# Patient Record
Sex: Male | Born: 1985
Health system: Southern US, Community
[De-identification: ages and names within clinical notes are randomized; demographics above are authoritative.]

## PROBLEM LIST (undated history)

## (undated) DIAGNOSIS — T7840XA Allergy, unspecified, initial encounter: Secondary | ICD-10-CM

## (undated) DIAGNOSIS — L309 Dermatitis, unspecified: Secondary | ICD-10-CM

## (undated) HISTORY — DX: Dermatitis, unspecified: L30.9

## (undated) HISTORY — DX: Allergy, unspecified, initial encounter: T78.40XA

---
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
## 2005-01-30 ENCOUNTER — Ambulatory Visit: Payer: Self-pay | Admitting: Family Medicine

## 2005-02-01 ENCOUNTER — Ambulatory Visit: Payer: Self-pay | Admitting: Gastroenterology

## 2005-03-06 ENCOUNTER — Ambulatory Visit: Payer: Self-pay | Admitting: Family Medicine

## 2007-04-05 ENCOUNTER — Ambulatory Visit: Payer: Self-pay | Admitting: Family Medicine

## 2007-04-05 DIAGNOSIS — K602 Anal fissure, unspecified: Secondary | ICD-10-CM

## 2012-09-30 ENCOUNTER — Telehealth: Payer: Self-pay | Admitting: Family Medicine

## 2012-09-30 NOTE — Telephone Encounter (Signed)
Okay but not in the next 2 weeks

## 2012-09-30 NOTE — Telephone Encounter (Signed)
Pt was last seen 2009. Pt would like to sch cpx. Pt has been away at college. Can I cpx or re-est appt?

## 2012-10-01 NOTE — Telephone Encounter (Signed)
lmom for pt to call back

## 2012-10-01 NOTE — Telephone Encounter (Signed)
Pt is sch for 10-8

## 2012-10-23 ENCOUNTER — Encounter: Payer: Self-pay | Admitting: Family Medicine

## 2012-10-23 ENCOUNTER — Ambulatory Visit (INDEPENDENT_AMBULATORY_CARE_PROVIDER_SITE_OTHER): Payer: Managed Care, Other (non HMO) | Admitting: Family Medicine

## 2012-10-23 VITALS — BP 120/62 | HR 71 | Temp 98.8°F | Ht 69.5 in | Wt 239.0 lb

## 2012-10-23 DIAGNOSIS — Z0000 Encounter for general adult medical examination without abnormal findings: Secondary | ICD-10-CM

## 2012-10-23 DIAGNOSIS — B354 Tinea corporis: Secondary | ICD-10-CM

## 2012-10-23 MED ORDER — FLUCONAZOLE 100 MG PO TABS: 100.0000 mg | ORAL_TABLET | Freq: Two times a day (BID) | ORAL | Status: DC

## 2012-10-23 NOTE — Progress Notes (Signed)
  Subjective:    Patient ID: William Santana, male    DOB: 1985-06-09, 27 y.o.   MRN: [id number]  HPI 27 yr old male to re-establish and for a cpx. He has only one problem to discuss. About 3 weeks ago he developed an itchy rash in both armpits. He went to Urgent Care and was told this was a fungal infection. He was given ketoconazole cream which he used for several weeks. This helped the rash but it never went away. Then it started to spread to other areas of the body, especially the trunk and the groins. He has recently been using OTC antifungal creams. He says he went back to the Urgent Care and had complete labs drawn to make sure he is not diabetic, etc. He says this all returned as normal. He does bring me a copy of some health screening labs he has had done in the past year, the last time being this past May. His fasting glucoses were 101 and 100 then. Over the past few months he has been dieting and working out, and he has lost 26 lbs.    Review of Systems  Constitutional: Negative.   HENT: Negative.   Eyes: Negative.   Respiratory: Negative.   Cardiovascular: Negative.   Gastrointestinal: Negative.   Genitourinary: Negative.   Musculoskeletal: Negative.   Skin: Positive for rash. Negative for color change, pallor and wound.  Neurological: Negative.   Psychiatric/Behavioral: Negative.        Objective:   Physical Exam  Constitutional: He is oriented to person, place, and time. He appears well-developed and well-nourished. No distress.  HENT:  Head: Normocephalic and atraumatic.  Right Ear: External ear normal.  Left Ear: External ear normal.  Nose: Nose normal.  Mouth/Throat: Oropharynx is clear and moist. No oropharyngeal exudate.  Eyes: Conjunctivae and EOM are normal. Pupils are equal, round, and reactive to light. Right eye exhibits no discharge. Left eye exhibits no discharge. No scleral icterus.  Neck: Neck supple. No JVD present. No tracheal deviation present. No  thyromegaly present.  Cardiovascular: Normal rate, regular rhythm, normal heart sounds and intact distal pulses.  Exam reveals no gallop and no friction rub.   No murmur heard. Pulmonary/Chest: Effort normal and breath sounds normal. No respiratory distress. He has no wheezes. He has no rales. He exhibits no tenderness.  Abdominal: Soft. Bowel sounds are normal. He exhibits no distension and no mass. There is no tenderness. There is no rebound and no guarding.  Genitourinary: Rectum normal, prostate normal and penis normal. Guaiac negative stool. No penile tenderness.  Musculoskeletal: Normal range of motion. He exhibits no edema and no tenderness.  Lymphadenopathy:    He has no cervical adenopathy.  Neurological: He is alert and oriented to person, place, and time. He has normal reflexes. No cranial nerve deficit. He exhibits normal muscle tone. Coordination normal.  Skin: Skin is warm and dry. Rash noted. He is not diaphoretic. No erythema. No pallor.  Hyperpigmented light brown rash in the axillae, under the breasts, and in the groins. He also has scattered smaller spots of this rash over the trunk   Psychiatric: He has a normal mood and affect. His behavior is normal. Judgment and thought content normal.          Assessment & Plan:  Well exam. He will sign a release so we can have his recent labs sent to [place]. Try oral fluconazole for the rash.

## 2012-12-06 ENCOUNTER — Encounter: Payer: Self-pay | Admitting: Family Medicine

## 2012-12-06 ENCOUNTER — Ambulatory Visit (INDEPENDENT_AMBULATORY_CARE_PROVIDER_SITE_OTHER): Payer: Managed Care, Other (non HMO) | Admitting: Family Medicine

## 2012-12-06 VITALS — BP 126/64 | HR 87 | Temp 98.9°F | Wt 236.0 lb

## 2012-12-06 DIAGNOSIS — R21 Rash and other nonspecific skin eruption: Secondary | ICD-10-CM

## 2012-12-06 DIAGNOSIS — B009 Herpesviral infection, unspecified: Secondary | ICD-10-CM

## 2012-12-06 MED ORDER — VALACYCLOVIR HCL 500 MG PO TABS: 500.0000 mg | ORAL_TABLET | Freq: Two times a day (BID) | ORAL | Status: DC

## 2012-12-06 NOTE — Progress Notes (Signed)
Pre visit review using our clinic review tool, if applicable. No additional management support is needed unless otherwise documented below in the visit note. 

## 2012-12-06 NOTE — Progress Notes (Signed)
  Subjective:    Patient ID: KAIEL WEIDE, male    DOB: May 03, 1985, 27 y.o.   MRN: [id number]  HPI Here to recheck rashes on the chest and in the axillae, and to ask about a new rash in the genital area. He was seen 6 weeks ago for a cpx, and we diagnosed a fungal infection as above. He took Diflucan 100 mg bid for a month with little improvement. Now for the past 4 days he has had a painful blistering rash on the penis and over the pubis.    Review of Systems  Constitutional: Negative.   Skin: Positive for rash.       Objective:   Physical Exam  Constitutional: He appears well-developed and well-nourished.  Skin:  Large macular areas of hyperpigmentation in both axillae and over the anterior trunk. He also has numerous shallow ulcerated areas and vesicles on the pubis and the penis          Assessment & Plan:  The large macular areas have not responded to the antifungal meds as expected, so we will refer him to Dermatology. He has his first outbreak of genital herpes, so we will treat with Valtrex.

## 2014-02-13 ENCOUNTER — Ambulatory Visit (INDEPENDENT_AMBULATORY_CARE_PROVIDER_SITE_OTHER): Payer: Managed Care, Other (non HMO) | Admitting: Family Medicine

## 2014-02-13 ENCOUNTER — Encounter: Payer: Self-pay | Admitting: Family Medicine

## 2014-02-13 VITALS — BP 104/62 | HR 76 | Temp 98.8°F | Ht 69.5 in | Wt 194.0 lb

## 2014-02-13 DIAGNOSIS — Z0000 Encounter for general adult medical examination without abnormal findings: Secondary | ICD-10-CM

## 2014-02-13 DIAGNOSIS — Z23 Encounter for immunization: Secondary | ICD-10-CM

## 2014-02-13 LAB — POCT URINALYSIS DIPSTICK
BILIRUBIN UA: NEGATIVE
GLUCOSE UA: NEGATIVE
Ketones, UA: NEGATIVE
Leukocytes, UA: NEGATIVE
Nitrite, UA: NEGATIVE
PH UA: 7
Protein, UA: NEGATIVE
RBC UA: NEGATIVE
Spec Grav, UA: 1.01
Urobilinogen, UA: 0.2

## 2014-02-13 LAB — CBC WITH DIFFERENTIAL/PLATELET
Basophils Absolute: 0 10*3/uL (ref 0.0–0.1)
Basophils Relative: 0.5 % (ref 0.0–3.0)
Eosinophils Absolute: 0.1 10*3/uL (ref 0.0–0.7)
Eosinophils Relative: 1 % (ref 0.0–5.0)
HEMATOCRIT: 39.6 % (ref 39.0–52.0)
HEMOGLOBIN: 13.5 g/dL (ref 13.0–17.0)
Lymphocytes Relative: 23.3 % (ref 12.0–46.0)
Lymphs Abs: 1.5 10*3/uL (ref 0.7–4.0)
MCHC: 34 g/dL (ref 30.0–36.0)
MCV: 84.2 fl (ref 78.0–100.0)
Monocytes Absolute: 0.7 10*3/uL (ref 0.1–1.0)
Monocytes Relative: 10.5 % (ref 3.0–12.0)
Neutro Abs: 4.2 10*3/uL (ref 1.4–7.7)
Neutrophils Relative %: 64.7 % (ref 43.0–77.0)
Platelets: 343 10*3/uL (ref 150.0–400.0)
RBC: 4.71 Mil/uL (ref 4.22–5.81)
RDW: 13.7 % (ref 11.5–15.5)
WBC: 6.5 10*3/uL (ref 4.0–10.5)

## 2014-02-13 LAB — TSH: TSH: 0.63 u[IU]/mL (ref 0.35–4.50)

## 2014-02-13 LAB — BASIC METABOLIC PANEL
BUN: 12 mg/dL (ref 6–23)
CHLORIDE: 102 meq/L (ref 96–112)
CO2: 26 meq/L (ref 19–32)
Calcium: 9.8 mg/dL (ref 8.4–10.5)
Creatinine, Ser: 0.98 mg/dL (ref 0.40–1.50)
GFR: 96.24 mL/min (ref >60.00)
Glucose, Bld: 86 mg/dL (ref 70–99)
POTASSIUM: 4.8 meq/L (ref 3.5–5.1)
SODIUM: 138 meq/L (ref 135–145)

## 2014-02-13 LAB — HEPATIC FUNCTION PANEL
ALK PHOS: 83 U/L (ref 39–117)
ALT: 24 U/L (ref 0–53)
AST: 20 U/L (ref 0–37)
Albumin: 4.2 g/dL (ref 3.5–5.2)
BILIRUBIN DIRECT: 0.2 mg/dL (ref 0.0–0.3)
Total Bilirubin: 1 mg/dL (ref 0.2–1.2)
Total Protein: 8.1 g/dL (ref 6.0–8.3)

## 2014-02-13 MED ORDER — TERBINAFINE HCL 1 % EX CREA: 1.0000 "application " | TOPICAL_CREAM | Freq: Two times a day (BID) | CUTANEOUS | Status: DC

## 2014-02-13 MED ORDER — TRIAMCINOLONE ACETONIDE 0.1 % EX CREA: 1.0000 "application " | TOPICAL_CREAM | Freq: Three times a day (TID) | CUTANEOUS | Status: AC

## 2014-02-13 NOTE — Addendum Note (Signed)
Addended by: Aniceto BossNIMMONS, Zyana Amaro A on: 02/13/2014 12:27 PM   Modules accepted: Orders

## 2014-02-13 NOTE — Progress Notes (Signed)
Pre visit review using our clinic review tool, if applicable. No additional management support is needed unless otherwise documented below in the visit note. 

## 2014-02-13 NOTE — Progress Notes (Signed)
   Subjective:    Patient ID: William MedinMatthew D Santana, male    DOB: 07/11/1985, 29 y.o.   MRN: [id number]  HPI 29 yr old male for a cpx he is doing well except for skin issues. He saw Dr. Ethelene Halathcart last year for an unusual rash and a biopsy revealed it to be a form of eczema. He uses triamacinolone cream for this prn. He also gets an occasional rash in the groins area that had been felt to be herpetic. However last year he saw a doctor who tested for herpes simplex and this was negative. He was treated for tinea cruris with antifungal meds and it resolved. He had a health screening on his job a few months ago and had a normal lipid panel.    Review of Systems  Constitutional: Negative.   HENT: Negative.   Eyes: Negative.   Respiratory: Negative.   Cardiovascular: Negative.   Gastrointestinal: Negative.   Genitourinary: Negative.   Musculoskeletal: Negative.   Skin: Negative.   Neurological: Negative.   Psychiatric/Behavioral: Negative.        Objective:   Physical Exam  Constitutional: He is oriented to person, place, and time. He appears well-developed and well-nourished. No distress.  HENT:  Head: Normocephalic and atraumatic.  Right Ear: External ear normal.  Left Ear: External ear normal.  Nose: Nose normal.  Mouth/Throat: Oropharynx is clear and moist. No oropharyngeal exudate.  Eyes: Conjunctivae and EOM are normal. Pupils are equal, round, and reactive to light. Right eye exhibits no discharge. Left eye exhibits no discharge. No scleral icterus.  Neck: Neck supple. No JVD present. No tracheal deviation present. No thyromegaly present.  Cardiovascular: Normal rate, regular rhythm, normal heart sounds and intact distal pulses.  Exam reveals no gallop and no friction rub.   No murmur heard. Pulmonary/Chest: Effort normal and breath sounds normal. No respiratory distress. He has no wheezes. He has no rales. He exhibits no tenderness.  Abdominal: Soft. Bowel sounds are normal. He exhibits  no distension and no mass. There is no tenderness. There is no rebound and no guarding.  Genitourinary: Rectum normal, prostate normal and penis normal. Guaiac negative stool. No penile tenderness.  Musculoskeletal: Normal range of motion. He exhibits no edema or tenderness.  Lymphadenopathy:    He has no cervical adenopathy.  Neurological: He is alert and oriented to person, place, and time. He has normal reflexes. No cranial nerve deficit. He exhibits normal muscle tone. Coordination normal.  Skin: Skin is warm and dry. No rash noted. He is not diaphoretic. No erythema. No pallor.  Psychiatric: He has a normal mood and affect. His behavior is normal. Judgment and thought content normal.          Assessment & Plan:  Well exam. Get labs

## 2015-05-19 ENCOUNTER — Ambulatory Visit (INDEPENDENT_AMBULATORY_CARE_PROVIDER_SITE_OTHER): Payer: Managed Care, Other (non HMO) | Admitting: Family Medicine

## 2015-05-19 ENCOUNTER — Encounter: Payer: Self-pay | Admitting: Family Medicine

## 2015-05-19 VITALS — BP 112/72 | HR 88 | Temp 98.3°F | Resp 16 | Ht 69.5 in | Wt 199.0 lb

## 2015-05-19 DIAGNOSIS — Z0000 Encounter for general adult medical examination without abnormal findings: Secondary | ICD-10-CM

## 2015-05-19 NOTE — Progress Notes (Signed)
   Subjective:    Patient ID: William Santana, male    DOB: 12/01/1985, 30 y.o.   MRN: [id number]  HPI 30 yr old male for a cpx. He feels well. He has a wellness lab screening every year at his job, and this will be coming up in a few weeks.    Review of Systems  Constitutional: Negative.   HENT: Negative.   Eyes: Negative.   Respiratory: Negative.   Cardiovascular: Negative.   Gastrointestinal: Negative.   Genitourinary: Negative.   Musculoskeletal: Negative.   Skin: Negative.   Neurological: Negative.   Psychiatric/Behavioral: Negative.        Objective:   Physical Exam  Constitutional: He is oriented to person, place, and time. He appears well-developed and well-nourished. No distress.  HENT:  Head: Normocephalic and atraumatic.  Right Ear: External ear normal.  Left Ear: External ear normal.  Nose: Nose normal.  Mouth/Throat: Oropharynx is clear and moist. No oropharyngeal exudate.  Eyes: Conjunctivae and EOM are normal. Pupils are equal, round, and reactive to light. Right eye exhibits no discharge. Left eye exhibits no discharge. No scleral icterus.  Neck: Neck supple. No JVD present. No tracheal deviation present. No thyromegaly present.  Cardiovascular: Normal rate, regular rhythm, normal heart sounds and intact distal pulses.  Exam reveals no gallop and no friction rub.   No murmur heard. Pulmonary/Chest: Effort normal and breath sounds normal. No respiratory distress. He has no wheezes. He has no rales. He exhibits no tenderness.  Abdominal: Soft. Bowel sounds are normal. He exhibits no distension and no mass. There is no tenderness. There is no rebound and no guarding.  Genitourinary: Rectum normal, prostate normal and penis normal. Guaiac negative stool. No penile tenderness.  Musculoskeletal: Normal range of motion. He exhibits no edema or tenderness.  Lymphadenopathy:    He has no cervical adenopathy.  Neurological: He is alert and oriented to person, place, and  time. He has normal reflexes. No cranial nerve deficit. He exhibits normal muscle tone. Coordination normal.  Skin: Skin is warm and dry. No rash noted. He is not diaphoretic. No erythema. No pallor.  Psychiatric: He has a normal mood and affect. His behavior is normal. Judgment and thought content normal.          Assessment & Plan:  Well exam. We discussed diet and exercise. He will forward us a copy of his labs when they are back.  Nelwyn SalisburyFRY,STEPHEN A, MD

## 2016-05-23 ENCOUNTER — Encounter: Payer: Managed Care, Other (non HMO) | Admitting: Family Medicine

## 2016-06-02 ENCOUNTER — Encounter: Payer: Self-pay | Admitting: Family Medicine

## 2016-06-02 ENCOUNTER — Ambulatory Visit (INDEPENDENT_AMBULATORY_CARE_PROVIDER_SITE_OTHER): Payer: BLUE CROSS/BLUE SHIELD | Admitting: Family Medicine

## 2016-06-02 VITALS — BP 108/68 | HR 64 | Temp 97.8°F | Ht 69.5 in | Wt 204.0 lb

## 2016-06-02 DIAGNOSIS — Z0000 Encounter for general adult medical examination without abnormal findings: Secondary | ICD-10-CM | POA: Diagnosis not present

## 2016-06-02 LAB — BASIC METABOLIC PANEL
BUN: 14 mg/dL (ref 6–23)
CO2: 32 meq/L (ref 19–32)
Calcium: 9.4 mg/dL (ref 8.4–10.5)
Chloride: 103 mEq/L (ref 96–112)
Creatinine, Ser: 1.02 mg/dL (ref 0.40–1.50)
GFR: 90.47 mL/min (ref >60.00)
GLUCOSE: 98 mg/dL (ref 70–99)
Potassium: 4.3 mEq/L (ref 3.5–5.1)
SODIUM: 140 meq/L (ref 135–145)

## 2016-06-02 LAB — POC URINALSYSI DIPSTICK (AUTOMATED)
Bilirubin, UA: NEGATIVE
Blood, UA: NEGATIVE
GLUCOSE UA: NEGATIVE
KETONES UA: NEGATIVE
Leukocytes, UA: NEGATIVE
Nitrite, UA: NEGATIVE
Protein, UA: NEGATIVE
SPEC GRAV UA: 1.01 (ref 1.010–1.025)
Urobilinogen, UA: 0.2 E.U./dL
pH, UA: 7 (ref 5.0–8.0)

## 2016-06-02 LAB — CBC WITH DIFFERENTIAL/PLATELET
BASOS ABS: 0 10*3/uL (ref 0.0–0.1)
BASOS PCT: 0.5 % (ref 0.0–3.0)
EOS ABS: 0.2 10*3/uL (ref 0.0–0.7)
Eosinophils Relative: 3.1 % (ref 0.0–5.0)
HEMATOCRIT: 36.2 % — AB (ref 39.0–52.0)
HEMOGLOBIN: 11.8 g/dL — AB (ref 13.0–17.0)
LYMPHS PCT: 16.6 % (ref 12.0–46.0)
Lymphs Abs: 1.3 10*3/uL (ref 0.7–4.0)
MCHC: 32.6 g/dL (ref 30.0–36.0)
MCV: 81.7 fl (ref 78.0–100.0)
MONO ABS: 0.8 10*3/uL (ref 0.1–1.0)
Monocytes Relative: 9.9 % (ref 3.0–12.0)
Neutro Abs: 5.6 10*3/uL (ref 1.4–7.7)
Neutrophils Relative %: 69.9 % (ref 43.0–77.0)
Platelets: 405 10*3/uL — ABNORMAL HIGH (ref 150.0–400.0)
RBC: 4.43 Mil/uL (ref 4.22–5.81)
RDW: 15 % (ref 11.5–15.5)
WBC: 8 10*3/uL (ref 4.0–10.5)

## 2016-06-02 LAB — HEPATIC FUNCTION PANEL
ALK PHOS: 75 U/L (ref 39–117)
ALT: 15 U/L (ref 0–53)
AST: 16 U/L (ref 0–37)
Albumin: 4 g/dL (ref 3.5–5.2)
BILIRUBIN DIRECT: 0.2 mg/dL (ref 0.0–0.3)
Total Bilirubin: 0.7 mg/dL (ref 0.2–1.2)
Total Protein: 7.6 g/dL (ref 6.0–8.3)

## 2016-06-02 LAB — TSH: TSH: 1.9 u[IU]/mL (ref 0.35–4.50)

## 2016-06-02 LAB — LIPID PANEL
CHOLESTEROL: 159 mg/dL (ref 0–200)
HDL: 38 mg/dL — AB (ref >39.00)
LDL Cholesterol: 107 mg/dL — ABNORMAL HIGH (ref 0–99)
NONHDL: 121.14
Total CHOL/HDL Ratio: 4
Triglycerides: 71 mg/dL (ref 0.0–149.0)
VLDL: 14.2 mg/dL (ref 0.0–40.0)

## 2016-06-02 NOTE — Progress Notes (Signed)
   Subjective:    Patient ID: William Santana, male    DOB: 03/30/1985, 31 y.o.   MRN: [id number]  HPI 31 yr old male for a well exam. He feels fine.    Review of Systems  Constitutional: Negative.   HENT: Negative.   Eyes: Negative.   Respiratory: Negative.   Cardiovascular: Negative.   Gastrointestinal: Negative.   Genitourinary: Negative.   Musculoskeletal: Negative.   Skin: Negative.   Neurological: Negative.   Psychiatric/Behavioral: Negative.        Objective:   Physical Exam  Constitutional: He is oriented to person, place, and time. He appears well-developed and well-nourished. No distress.  HENT:  Head: Normocephalic and atraumatic.  Right Ear: External ear normal.  Left Ear: External ear normal.  Nose: Nose normal.  Mouth/Throat: Oropharynx is clear and moist. No oropharyngeal exudate.  Eyes: Conjunctivae and EOM are normal. Pupils are equal, round, and reactive to light. Right eye exhibits no discharge. Left eye exhibits no discharge. No scleral icterus.  Neck: Neck supple. No JVD present. No tracheal deviation present. No thyromegaly present.  Cardiovascular: Normal rate, regular rhythm, normal heart sounds and intact distal pulses.  Exam reveals no gallop and no friction rub.   No murmur heard. Pulmonary/Chest: Effort normal and breath sounds normal. No respiratory distress. He has no wheezes. He has no rales. He exhibits no tenderness.  Abdominal: Soft. Bowel sounds are normal. He exhibits no distension and no mass. There is no tenderness. There is no rebound and no guarding.  Genitourinary: Penis normal. No penile tenderness.  Musculoskeletal: Normal range of motion. He exhibits no edema or tenderness.  Lymphadenopathy:    He has no cervical adenopathy.  Neurological: He is alert and oriented to person, place, and time. He has normal reflexes. No cranial nerve deficit. He exhibits normal muscle tone. Coordination normal.  Skin: Skin is warm and dry. No rash  noted. He is not diaphoretic. No erythema. No pallor.  Psychiatric: He has a normal mood and affect. His behavior is normal. Judgment and thought content normal.          Assessment & Plan:  Well exam. We discussed diet and exercise. Get labs today.  Gershon CraneStephen Fry, MD

## 2016-06-02 NOTE — Patient Instructions (Signed)
WE NOW OFFER   [facility] FAST TRACK!!!  SAME DAY Appointments for ACUTE CARE  Such as: Sprains, Injuries, cuts, abrasions, rashes, muscle pain, joint pain, back pain Colds, flu, sore throats, headache, allergies, cough, fever  Ear pain, sinus and eye infections Abdominal pain, nausea, vomiting, diarrhea, upset stomach Animal/insect bites  3 Easy Ways to Schedule: Walk-In Scheduling Call in scheduling Mychart Sign-up: [email or web address]         

## 2017-05-05 DIAGNOSIS — H52223 Regular astigmatism, bilateral: Secondary | ICD-10-CM | POA: Diagnosis not present

## 2017-05-05 DIAGNOSIS — Z0100 Encounter for examination of eyes and vision without abnormal findings: Secondary | ICD-10-CM | POA: Diagnosis not present

## 2017-07-02 ENCOUNTER — Ambulatory Visit (INDEPENDENT_AMBULATORY_CARE_PROVIDER_SITE_OTHER): Payer: 59 | Admitting: Family Medicine

## 2017-07-02 ENCOUNTER — Ambulatory Visit (INDEPENDENT_AMBULATORY_CARE_PROVIDER_SITE_OTHER)
Admission: RE | Admit: 2017-07-02 | Discharge: 2017-07-02 | Disposition: A | Discharge status: Home / Self Care | Payer: 59 | Source: Ambulatory Visit | Attending: Family Medicine | Admitting: Family Medicine

## 2017-07-02 ENCOUNTER — Encounter: Payer: Self-pay | Admitting: Family Medicine

## 2017-07-02 VITALS — BP 112/78 | HR 74 | Temp 98.5°F | Ht 69.25 in | Wt 211.6 lb

## 2017-07-02 DIAGNOSIS — Z0000 Encounter for general adult medical examination without abnormal findings: Secondary | ICD-10-CM | POA: Diagnosis not present

## 2017-07-02 DIAGNOSIS — R05 Cough: Secondary | ICD-10-CM

## 2017-07-02 DIAGNOSIS — R059 Cough, unspecified: Secondary | ICD-10-CM

## 2017-07-02 LAB — CBC WITH DIFFERENTIAL/PLATELET
BASOS PCT: 0.5 % (ref 0.0–3.0)
Basophils Absolute: 0 10*3/uL (ref 0.0–0.1)
Eosinophils Absolute: 0.2 10*3/uL (ref 0.0–0.7)
Eosinophils Relative: 2.5 % (ref 0.0–5.0)
HEMATOCRIT: 38.5 % — AB (ref 39.0–52.0)
Hemoglobin: 12.8 g/dL — ABNORMAL LOW (ref 13.0–17.0)
LYMPHS PCT: 26 % (ref 12.0–46.0)
Lymphs Abs: 1.8 10*3/uL (ref 0.7–4.0)
MCHC: 33.3 g/dL (ref 30.0–36.0)
MCV: 83.4 fl (ref 78.0–100.0)
MONO ABS: 0.6 10*3/uL (ref 0.1–1.0)
Monocytes Relative: 9.4 % (ref 3.0–12.0)
NEUTROS ABS: 4.2 10*3/uL (ref 1.4–7.7)
Neutrophils Relative %: 61.6 % (ref 43.0–77.0)
PLATELETS: 344 10*3/uL (ref 150.0–400.0)
RBC: 4.61 Mil/uL (ref 4.22–5.81)
RDW: 14.9 % (ref 11.5–15.5)
WBC: 6.8 10*3/uL (ref 4.0–10.5)

## 2017-07-02 LAB — POC URINALSYSI DIPSTICK (AUTOMATED)
BILIRUBIN UA: NEGATIVE
GLUCOSE UA: NEGATIVE
Ketones, UA: NEGATIVE
Leukocytes, UA: NEGATIVE
Nitrite, UA: NEGATIVE
Protein, UA: NEGATIVE
RBC UA: NEGATIVE
SPEC GRAV UA: 1.01 (ref 1.010–1.025)
Urobilinogen, UA: 0.2 E.U./dL
pH, UA: 6.5 (ref 5.0–8.0)

## 2017-07-02 LAB — HEPATIC FUNCTION PANEL
ALT: 18 U/L (ref 0–53)
AST: 16 U/L (ref 0–37)
Albumin: 4 g/dL (ref 3.5–5.2)
Alkaline Phosphatase: 62 U/L (ref 39–117)
BILIRUBIN DIRECT: 0.1 mg/dL (ref 0.0–0.3)
BILIRUBIN TOTAL: 0.7 mg/dL (ref 0.2–1.2)
Total Protein: 7.5 g/dL (ref 6.0–8.3)

## 2017-07-02 LAB — LIPID PANEL
CHOL/HDL RATIO: 4
Cholesterol: 152 mg/dL (ref 0–200)
HDL: 39.4 mg/dL (ref >39.00)
LDL Cholesterol: 96 mg/dL (ref 0–99)
NONHDL: 112.83
TRIGLYCERIDES: 84 mg/dL (ref 0.0–149.0)
VLDL: 16.8 mg/dL (ref 0.0–40.0)

## 2017-07-02 LAB — BASIC METABOLIC PANEL
BUN: 10 mg/dL (ref 6–23)
CALCIUM: 9.4 mg/dL (ref 8.4–10.5)
CHLORIDE: 102 meq/L (ref 96–112)
CO2: 28 mEq/L (ref 19–32)
CREATININE: 0.97 mg/dL (ref 0.40–1.50)
GFR: 95.21 mL/min (ref >60.00)
Glucose, Bld: 81 mg/dL (ref 70–99)
Potassium: 4.2 mEq/L (ref 3.5–5.1)
Sodium: 140 mEq/L (ref 135–145)

## 2017-07-02 LAB — TSH: TSH: 0.87 u[IU]/mL (ref 0.35–4.50)

## 2017-07-02 NOTE — Progress Notes (Signed)
   Subjective:    Patient ID: William Santana, male    DOB: 04/23/1985, 32 y.o.   MRN: [id number]  HPI Here for a well exam. He feels well today. About 2 weeks ago he developed a chest cold with a cough that produced yellow sputum, but this went away. No fever. He now feels back to normal.    Review of Systems  Constitutional: Negative.   HENT: Negative.   Eyes: Negative.   Respiratory: Negative.   Cardiovascular: Negative.   Gastrointestinal: Negative.   Genitourinary: Negative.   Musculoskeletal: Negative.   Skin: Negative.   Neurological: Negative.   Psychiatric/Behavioral: Negative.        Objective:   Physical Exam  Constitutional: He is oriented to person, place, and time. He appears well-developed and well-nourished. No distress.  HENT:  Head: Normocephalic and atraumatic.  Right Ear: External ear normal.  Left Ear: External ear normal.  Nose: Nose normal.  Mouth/Throat: Oropharynx is clear and moist. No oropharyngeal exudate.  Eyes: Pupils are equal, round, and reactive to light. Conjunctivae and EOM are normal. Right eye exhibits no discharge. Left eye exhibits no discharge. No scleral icterus.  Neck: Neck supple. No JVD present. No tracheal deviation present. No thyromegaly present.  Cardiovascular: Normal rate, regular rhythm, normal heart sounds and intact distal pulses. Exam reveals no gallop and no friction rub.  No murmur heard. Pulmonary/Chest: Effort normal. No respiratory distress. He has no rales. He exhibits no tenderness.  There are some soft rhonchi and wheezes on the right side midway down. These do not clear with coughing.   Abdominal: Soft. Bowel sounds are normal. He exhibits no distension and no mass. There is no tenderness. There is no rebound and no guarding.  Genitourinary: Rectum normal, prostate normal and penis normal. Rectal exam shows guaiac negative stool. No penile tenderness.  Musculoskeletal: Normal range of motion. He exhibits no edema or  tenderness.  Lymphadenopathy:    He has no cervical adenopathy.  Neurological: He is alert and oriented to person, place, and time. He has normal reflexes. He displays normal reflexes. No cranial nerve deficit. He exhibits normal muscle tone. Coordination normal.  Skin: Skin is warm and dry. No rash noted. He is not diaphoretic. No erythema. No pallor.  Psychiatric: He has a normal mood and affect. His behavior is normal. Judgment and thought content normal.          Assessment & Plan:  Well exam. We discussed diet and exercise. Get fasting labs. I am not sure what to make of the abnormal breath sounds on the right side, so we will send him for a CXR today.  Gershon CraneStephen Fry, MD

## 2018-11-28 DIAGNOSIS — Z20828 Contact with and (suspected) exposure to other viral communicable diseases: Secondary | ICD-10-CM | POA: Diagnosis not present

## 2019-10-28 IMAGING — DX DG CHEST 2V
2 series · 2 of 2 positions shown · non-contrast
Comparison: None

CLINICAL DATA: Cough congestion and cold for 2 weeks

EXAM:
CHEST - 2 VIEW

[chest pa]
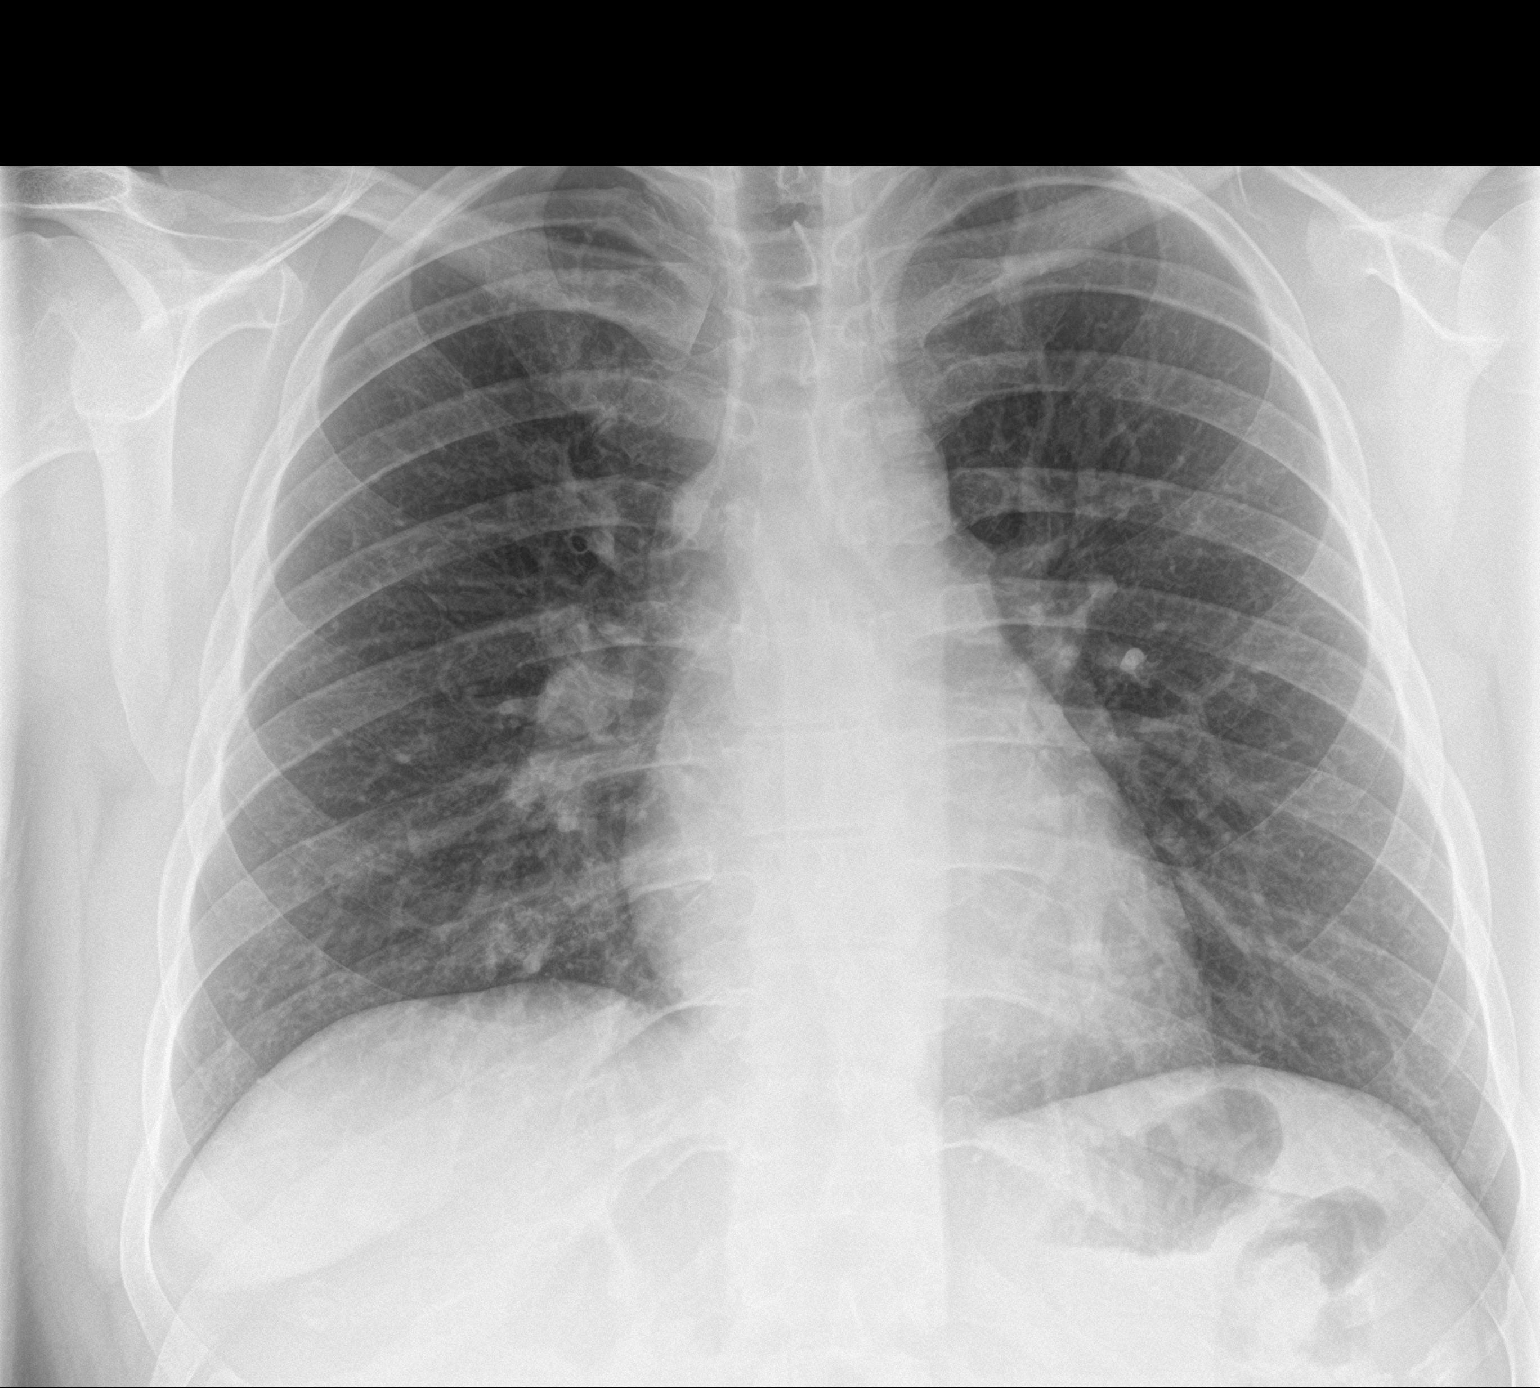

[chest lat]
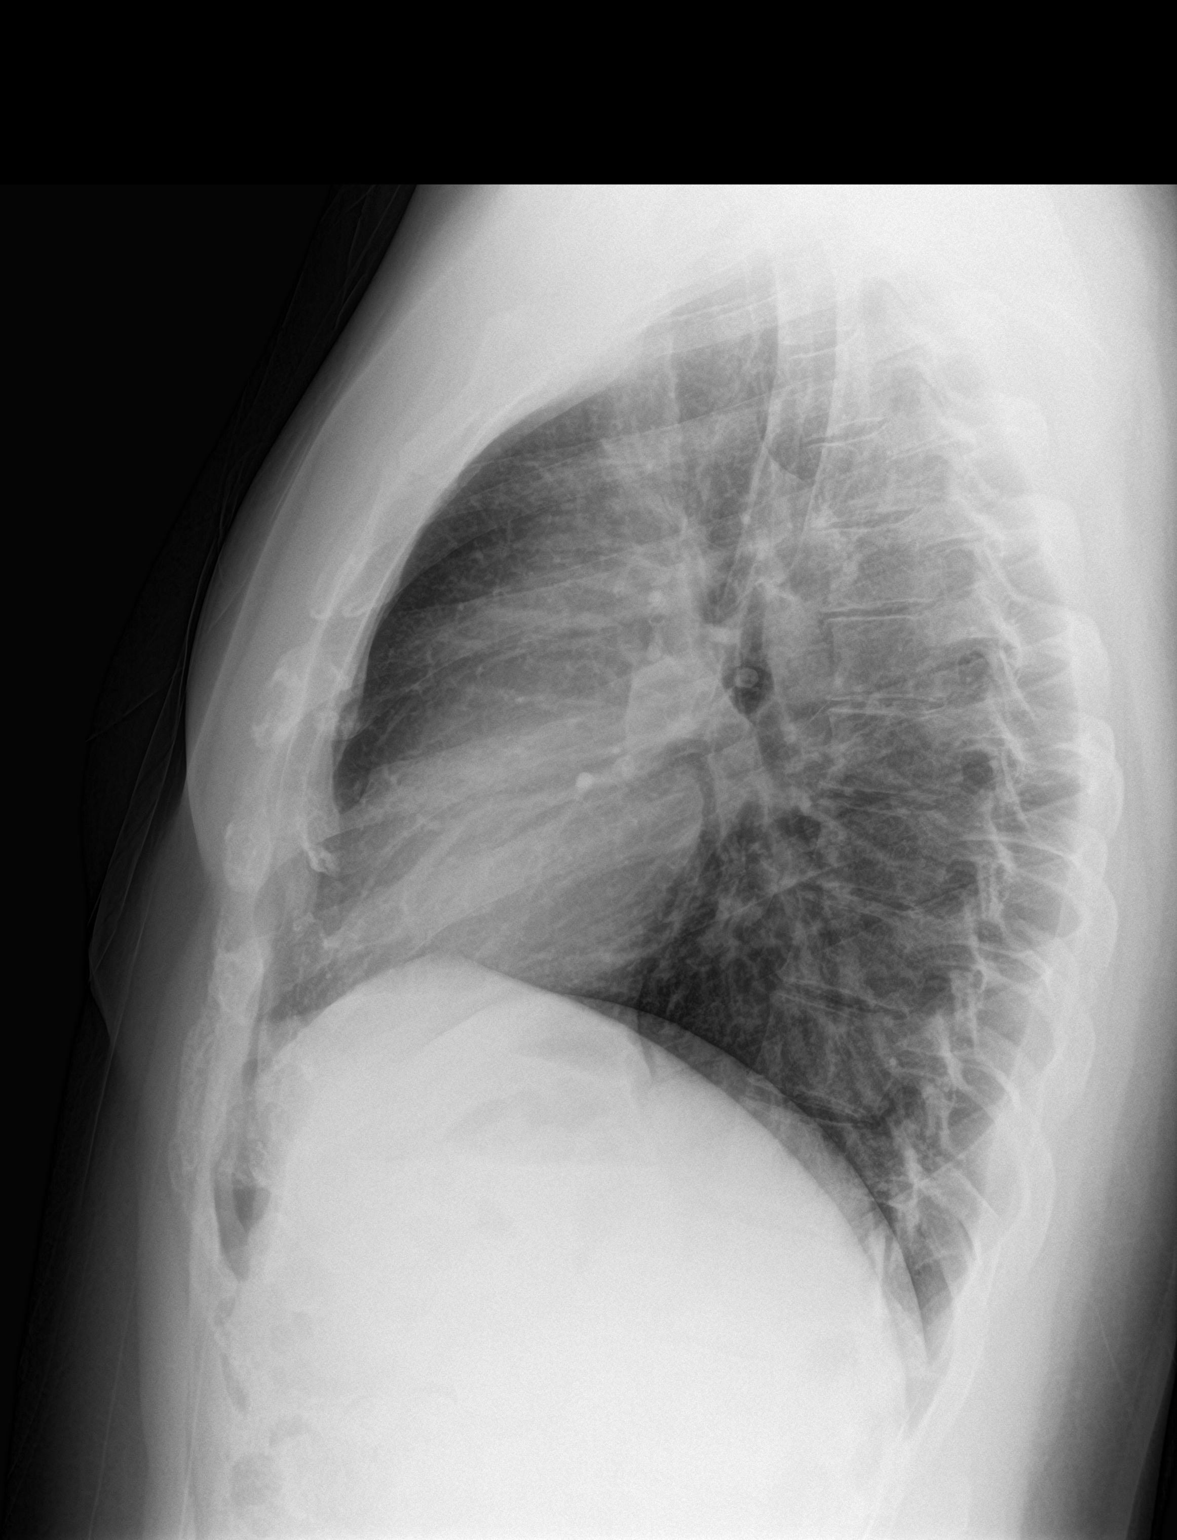

[2 of 2 positions shown; findings below may reference images not displayed]

FINDINGS: Minimal rotation to the RIGHT.

Normal heart size, mediastinal contours and pulmonary vascularity.

Lungs clear.

No acute infiltrate, pleural effusion, or pneumothorax.

Bones unremarkable.
IMPRESSION: No acute abnormalities.
